# Patient Record
Sex: Male | Born: 1985 | Race: White | Hispanic: No | Marital: Married | Smoking: Never smoker
Health system: Southern US, Community
[De-identification: ages and names within clinical notes are randomized; demographics above are authoritative.]

## PROBLEM LIST (undated history)

## (undated) HISTORY — PX: HERNIA REPAIR: SHX51

---
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
## 2009-11-22 ENCOUNTER — Ambulatory Visit: Payer: Self-pay | Admitting: Orthopedic Surgery

## 2011-03-03 ENCOUNTER — Ambulatory Visit: Payer: Self-pay | Admitting: Surgery

## 2016-02-21 ENCOUNTER — Encounter: Payer: Self-pay | Admitting: Emergency Medicine

## 2016-02-21 ENCOUNTER — Emergency Department: Payer: BC Managed Care – PPO

## 2016-02-21 ENCOUNTER — Emergency Department
Admission: EM | Admit: 2016-02-21 | Discharge: 2016-02-21 | Disposition: A | Discharge status: Home / Self Care | Payer: BC Managed Care – PPO | Attending: Emergency Medicine | Admitting: Emergency Medicine

## 2016-02-21 DIAGNOSIS — Y9361 Activity, american tackle football: Secondary | ICD-10-CM | POA: Insufficient documentation

## 2016-02-21 DIAGNOSIS — W1839XA Other fall on same level, initial encounter: Secondary | ICD-10-CM | POA: Diagnosis not present

## 2016-02-21 DIAGNOSIS — M25571 Pain in right ankle and joints of right foot: Secondary | ICD-10-CM | POA: Diagnosis present

## 2016-02-21 DIAGNOSIS — S93401A Sprain of unspecified ligament of right ankle, initial encounter: Secondary | ICD-10-CM | POA: Diagnosis not present

## 2016-02-21 DIAGNOSIS — Y998 Other external cause status: Secondary | ICD-10-CM | POA: Insufficient documentation

## 2016-02-21 DIAGNOSIS — Y92321 Football field as the place of occurrence of the external cause: Secondary | ICD-10-CM | POA: Diagnosis not present

## 2016-02-21 MED ORDER — MELOXICAM 15 MG PO TABS: 15.0000 mg | ORAL_TABLET | Freq: Every day | ORAL | Status: AC

## 2016-02-21 MED ORDER — HYDROCODONE-ACETAMINOPHEN 5-325 MG PO TABS: 1.0000 | ORAL_TABLET | ORAL | Status: AC | PRN

## 2016-02-21 NOTE — ED Provider Notes (Signed)
[facility] Emergency Department Provider Note  ____________________________________________  Time seen: Approximately 7:42 PM  I have reviewed the triage vital signs and the nursing notes.   HISTORY  Chief Complaint Ankle Pain    HPI Devin Buck is a 30 y.o. male who presents emergency department complaining of right foot/ankle pain. The patient states that he was playing football with the son and the yard when he twisted his foot. He fell a cracking sensation and had pain immediately. The patient states that he is unable to bear full weight on that show any. Area is painful, sharp, constant in nature. Patient does endorse swelling to the dorsal aspect of the foot. He denies any numbness or tingling in distal foot.   History reviewed. No pertinent past medical history.  There are no active problems to display for this patient.   History reviewed. No pertinent past surgical history.  Current Outpatient Rx  Name  Route  Sig  Dispense  Refill  . HYDROcodone-acetaminophen (NORCO/VICODIN) 5-325 MG tablet   Oral   Take 1 tablet by mouth every 4 (four) hours as needed for moderate pain.   20 tablet   0   . meloxicam (MOBIC) 15 MG tablet   Oral   Take 1 tablet (15 mg total) by mouth daily.   30 tablet   0     Allergies Review of patient's allergies indicates no known allergies.  No family history on file.  Social History Social History  Substance Use Topics  . Smoking status: Never Smoker   . Smokeless tobacco: None  . Alcohol Use: None     Review of Systems  Constitutional: No fever/chills Cardiovascular: no chest pain. Respiratory: no cough. No SOB. Musculoskeletal: Positive for right foot pain Skin: Negative for rash. Neurological: Negative for headaches, focal weakness or numbness. 10-point ROS otherwise negative.  ____________________________________________   PHYSICAL EXAM:  VITAL SIGNS: ED Triage Vitals  Enc Vitals  Group     BP --      Pulse --      Resp --      Temp --      Temp src --      SpO2 --      Weight --      Height --      Head Cir --      Peak Flow --      Pain Score 02/21/16 1802 8     Pain Loc --      Pain Edu? --      Excl. in GC? --      Constitutional: Alert and oriented. Well appearing and in no acute distress. Eyes: Conjunctivae are normal. PERRL. EOMI. Head: Atraumatic. Cardiovascular: Normal rate, regular rhythm. Normal S1 and S2.  Good peripheral circulation. Respiratory: Normal respiratory effort without tachypnea or retractions. Lungs CTAB. Musculoskeletal: Edema is noted to the dorsal and lateral aspect of the right foot. No deformity noted. Patient is diffusely tender to palpation along the lateral aspect of the foot. No point tenderness. No palpable abnormality. Limited range of motion due to pain. Sensation and cap refill intact all digits. Neurologic:  Normal speech and language. No gross focal neurologic deficits are appreciated.  Skin:  Skin is warm, dry and intact. No rash noted. Psychiatric: Mood and affect are normal. Speech and behavior are normal. Patient exhibits appropriate insight and judgement.   ____________________________________________   LABS (all labs ordered are listed, but only abnormal results are displayed)  Labs  Reviewed - No data to display ____________________________________________  EKG   ____________________________________________  RADIOLOGY Festus Barren Nidya Bouyer, personally viewed and evaluated these images (plain radiographs) as part of my medical decision making, as well as reviewing the written report by the radiologist.  Dg Foot Complete Right  02/21/2016  CLINICAL DATA:  Acute onset of right foot pain and swelling after twisting injury playing football in the yard today. EXAM: RIGHT FOOT COMPLETE - 3+ VIEW COMPARISON:  None. FINDINGS: Lucency through the tibial sesamoid appears well corticated on the AP and oblique  views. Bipartite sesamoid favored over sesamoid fracture. There is otherwise no evidence of foot fracture. There is no arthropathy or other focal bone abnormality. Soft tissues are unremarkable. IMPRESSION: Presumed bipartite tibial sesamoid versus less likely sesamoid fracture. Recommend correlation for focal tenderness in this region. There is otherwise no evidence of right foot fracture. Electronically Signed   By: Rubye Oaks M.D.   On: 02/21/2016 18:53    ____________________________________________    PROCEDURES  Procedure(s) performed:       Medications - No data to display   ____________________________________________   INITIAL IMPRESSION / ASSESSMENT AND PLAN / ED COURSE  Pertinent labs & imaging results that were available during my care of the patient were reviewed by me and considered in my medical decision making (see chart for details).  Patient's diagnosis is consistent with acute ankle sprain. Patient is given crutches here in the emergency department is instructed to use a stirrup ankle brace. Patient is given instructions on the RICE method. Patient will be discharged home with prescriptions for anti-inflammatories and limited pain medication. Patient is to follow up with orthopedics if symptoms persist past this treatment course. Patient is given ED precautions to return to the ED for any worsening or new symptoms.     ____________________________________________  FINAL CLINICAL IMPRESSION(S) / ED DIAGNOSES  Final diagnoses:  Right ankle sprain, initial encounter      NEW MEDICATIONS STARTED DURING THIS VISIT:  New Prescriptions   HYDROCODONE-ACETAMINOPHEN (NORCO/VICODIN) 5-325 MG TABLET    Take 1 tablet by mouth every 4 (four) hours as needed for moderate pain.   MELOXICAM (MOBIC) 15 MG TABLET    Take 1 tablet (15 mg total) by mouth daily.        This chart was dictated using voice recognition software/Dragon. Despite best efforts to  proofread, errors can occur which can change the meaning. Any change was purely unintentional.    Racheal Patches, PA-C 02/21/16 2010  Minna Antis, MD 02/21/16 [phone]

## 2016-02-21 NOTE — ED Notes (Signed)
Pt alert and oriented X4, active, cooperative, pt in NAD. RR even and unlabored, color WNL.  Pt informed to return if any life threatening symptoms occur.   

## 2016-02-21 NOTE — ED Notes (Signed)
Reports playing football and twisted ankle, swelling noted. Ice pack applied.

## 2016-02-21 NOTE — Discharge Instructions (Signed)
Ankle Sprain °An ankle sprain is an injury to the strong, fibrous tissues (ligaments) that hold the bones of your ankle joint together.  °CAUSES °An ankle sprain is usually caused by a fall or by twisting your ankle. Ankle sprains most commonly occur when you step on the outer edge of your foot, and your ankle turns inward. People who participate in sports are more prone to these types of injuries.  °SYMPTOMS  °· Pain in your ankle. The pain may be present at rest or only when you are trying to stand or walk. °· Swelling. °· Bruising. Bruising may develop immediately or within 1 to 2 days after your injury. °· Difficulty standing or walking, particularly when turning corners or changing directions. °DIAGNOSIS  °Your caregiver will ask you details about your injury and perform a physical exam of your ankle to determine if you have an ankle sprain. During the physical exam, your caregiver will press on and apply pressure to specific areas of your foot and ankle. Your caregiver will try to move your ankle in certain ways. An X-ray exam may be done to be sure a bone was not broken or a ligament did not separate from one of the bones in your ankle (avulsion fracture).  °TREATMENT  °Certain types of braces can help stabilize your ankle. Your caregiver can make a recommendation for this. Your caregiver may recommend the use of medicine for pain. If your sprain is severe, your caregiver may refer you to a surgeon who helps to restore function to parts of your skeletal system (orthopedist) or a physical therapist. °HOME CARE INSTRUCTIONS  °· Apply ice to your injury for 1-2 days or as directed by your caregiver. Applying ice helps to reduce inflammation and pain. °· Put ice in a plastic bag. °· Place a towel between your skin and the bag. °· Leave the ice on for 15-20 minutes at a time, every 2 hours while you are awake. °· Only take over-the-counter or prescription medicines for pain, discomfort, or fever as directed by  your caregiver. °· Elevate your injured ankle above the level of your heart as much as possible for 2-3 days. °· If your caregiver recommends crutches, use them as instructed. Gradually put weight on the affected ankle. Continue to use crutches or a cane until you can walk without feeling pain in your ankle. °· If you have a plaster splint, wear the splint as directed by your caregiver. Do not rest it on anything harder than a pillow for the first 24 hours. Do not put weight on it. Do not get it wet. You may take it off to take a shower or bath. °· You may have been given an elastic bandage to wear around your ankle to provide support. If the elastic bandage is too tight (you have numbness or tingling in your foot or your foot becomes cold and blue), adjust the bandage to make it comfortable. °· If you have an air splint, you may blow more air into it or let air out to make it more comfortable. You may take your splint off at night and before taking a shower or bath. Wiggle your toes in the splint several times per day to decrease swelling. °SEEK MEDICAL CARE IF:  °· You have rapidly increasing bruising or swelling. °· Your toes feel extremely cold or you lose feeling in your foot. °· Your pain is not relieved with medicine. °SEEK IMMEDIATE MEDICAL CARE IF: °· Your toes are numb or blue. °·   You have severe pain that is increasing. °MAKE SURE YOU:  °· Understand these instructions. °· Will watch your condition. °· Will get help right away if you are not doing well or get worse. °  °This information is not intended to replace advice given to you by your health care provider. Make sure you discuss any questions you have with your health care provider. °  °Document Released: 11/02/2005 Document Revised: 11/23/2014 Document Reviewed: 11/14/2011 °Elsevier Interactive Patient Education ©2016 Elsevier Inc. ° °Cryotherapy °Cryotherapy means treatment with cold. Ice or gel packs can be used to reduce both pain and swelling.  Ice is the most helpful within the first 24 to 48 hours after an injury or flare-up from overusing a muscle or joint. Sprains, strains, spasms, burning pain, shooting pain, and aches can all be eased with ice. Ice can also be used when recovering from surgery. Ice is effective, has very few side effects, and is safe for most people to use. °PRECAUTIONS  °Ice is not a safe treatment option for people with: °· Raynaud phenomenon. This is a condition affecting small blood vessels in the extremities. Exposure to cold may cause your problems to return. °· Cold hypersensitivity. There are many forms of cold hypersensitivity, including: °¨ Cold urticaria. Red, itchy hives appear on the skin when the tissues begin to warm after being iced. °¨ Cold erythema. This is a red, itchy rash caused by exposure to cold. °¨ Cold hemoglobinuria. Red blood cells break down when the tissues begin to warm after being iced. The hemoglobin that carry oxygen are passed into the urine because they cannot combine with blood proteins fast enough. °· Numbness or altered sensitivity in the area being iced. °If you have any of the following conditions, do not use ice until you have discussed cryotherapy with your caregiver: °· Heart conditions, such as arrhythmia, angina, or chronic heart disease. °· High blood pressure. °· Healing wounds or open skin in the area being iced. °· Current infections. °· Rheumatoid arthritis. °· Poor circulation. °· Diabetes. °Ice slows the blood flow in the region it is applied. This is beneficial when trying to stop inflamed tissues from spreading irritating chemicals to surrounding tissues. However, if you expose your skin to cold temperatures for too long or without the proper protection, you can damage your skin or nerves. Watch for signs of skin damage due to cold. °HOME CARE INSTRUCTIONS °Follow these tips to use ice and cold packs safely. °· Place a dry or damp towel between the ice and skin. A damp towel will  cool the skin more quickly, so you may need to shorten the time that the ice is used. °· For a more rapid response, add gentle compression to the ice. °· Ice for no more than 10 to 20 minutes at a time. The bonier the area you are icing, the less time it will take to get the benefits of ice. °· Check your skin after 5 minutes to make sure there are no signs of a poor response to cold or skin damage. °· Rest 20 minutes or more between uses. °· Once your skin is numb, you can end your treatment. You can test numbness by very lightly touching your skin. The touch should be so light that you do not see the skin dimple from the pressure of your fingertip. When using ice, most people will feel these normal sensations in this order: cold, burning, aching, and numbness. °· Do not use ice on someone who   cannot communicate their responses to pain, such as small children or people with dementia. °HOW TO MAKE AN ICE PACK °Ice packs are the most common way to use ice therapy. Other methods include ice massage, ice baths, and cryosprays. Muscle creams that cause a cold, tingly feeling do not offer the same benefits that ice offers and should not be used as a substitute unless recommended by your caregiver. °To make an ice pack, do one of the following: °· Place crushed ice or a bag of frozen vegetables in a sealable plastic bag. Squeeze out the excess air. Place this bag inside another plastic bag. Slide the bag into a pillowcase or place a damp towel between your skin and the bag. °· Mix 3 parts water with 1 part rubbing alcohol. Freeze the mixture in a sealable plastic bag. When you remove the mixture from the freezer, it will be slushy. Squeeze out the excess air. Place this bag inside another plastic bag. Slide the bag into a pillowcase or place a damp towel between your skin and the bag. °SEEK MEDICAL CARE IF: °· You develop white spots on your skin. This may give the skin a blotchy (mottled) appearance. °· Your skin turns  blue or pale. °· Your skin becomes waxy or hard. °· Your swelling gets worse. °MAKE SURE YOU:  °· Understand these instructions. °· Will watch your condition. °· Will get help right away if you are not doing well or get worse. °  °This information is not intended to replace advice given to you by your health care provider. Make sure you discuss any questions you have with your health care provider. °  °Document Released: 06/29/2011 Document Revised: 11/23/2014 Document Reviewed: 06/29/2011 °Elsevier Interactive Patient Education ©2016 Elsevier Inc. ° °Stirrup Ankle Brace °Stirrup ankle braces give support and help stabilize the ankle joint. They are rigid pieces of plastic or fiberglass that go up both sides of the lower leg with the bottom of the stirrup fitting comfortably under the bottom of the instep of the foot. It can be held on with Velcro straps or an elastic wrap. Stirrup ankle braces are used to support the ankle following mild or moderate sprains or strains, or fractures after cast removal.  °They can be easily removed or adjusted if there is swelling. The rigid brace shells are designed to fit the ankle comfortably and provide the needed medial/lateral stabilization. This brace can be easily worn with most athletic shoes. The brace liner is usually made of a soft, comfortable gel-like material. This gel fits the ankle well without causing uncomfortable pressure points.  °IMPORTANCE OF ANKLE BRACES: °· The use of ankle bracing is effective in the prevention of ankle sprains. °· In athletes, the use of ankle bracing will offer protection and prevent further sprains. °· Research shows that a complete rehabilitation program needs to be included with external bracing. This includes range of motion and ankle strengthening exercises. Your caregivers will instruct you in this. °If you were given the brace today for a new injury, use the following home care instructions as a guide. °HOME CARE INSTRUCTIONS   °· Apply ice to the sore area for 15-20 minutes, 03-04 times per day while awake for the first 2 days. Put the ice in a plastic bag and place a towel between the bag of ice and your skin. Never place the ice pack directly on your skin. Be especially careful using ice on an elbow or knee or other bony area, such as   your ankle, because icing for too long may damage the nerves which are close to the surface.  Keep your leg elevated when possible to lessen swelling.  Wear your splint until you are seen for a follow-up examination. Do not put weight on it. Do not get it wet. You may take it off to take a shower or bath.  For Activity: Use crutches with non-weight bearing for 1 week. Then, you may walk on your ankle as instructed. Start gradually with weight bearing on the affected ankle.  Continue to use crutches or a cane until you can stand on your ankle without causing pain.  Wiggle your toes in the splint several times per day if you are able.  The splint is too tight if you have numbness, tingling, or if your foot becomes cold and blue. Adjust the straps or elastic bandage to make it comfortable.  Only take over-the-counter or prescription medicines for pain, discomfort, or fever as directed by your caregiver. SEEK IMMEDIATE MEDICAL CARE IF:   You have increased bruising, swelling or pain.  Your toes are blue or cold and loosening the brace or wrap does not help.  Your pain is not relieved with medicine. MAKE SURE YOU:   Understand these instructions.  Will watch your condition.  Will get help right away if you are not doing well or get worse.   This information is not intended to replace advice given to you by your health care provider. Make sure you discuss any questions you have with your health care provider.   Document Released: 09/02/2004 Document Revised: 01/25/2012 Document Reviewed: 06/04/2015 Elsevier Interactive Patient Education 2016 Elsevier Inc.  Adult nurselastic Bandage and  RICE WHAT DOES AN ELASTIC BANDAGE DO? Elastic bandages come in different shapes and sizes. They generally provide support to your injury and reduce swelling while you are healing, but they can perform different functions. Your health care provider will help you to decide what is best for your protection, recovery, or rehabilitation following an injury. WHAT ARE SOME GENERAL TIPS FOR USING AN ELASTIC BANDAGE?  Use the bandage as directed by the maker of the bandage that you are using.  Do not wrap the bandage too tightly. This may cut off the circulation in the arm or leg in the area below the bandage.  If part of your body beyond the bandage becomes blue, numb, cold, swollen, or is more painful, your bandage is most likely too tight. If this occurs, remove your bandage and reapply it more loosely.  See your health care provider if the bandage seems to be making your problems worse rather than better.  An elastic bandage should be removed and reapplied every 3-4 hours or as directed by your health care provider. WHAT IS RICE? The routine care of many injuries includes rest, ice, compression, and elevation (RICE therapy).  Rest Rest is required to allow your body to heal. Generally, you can resume your routine activities when you are comfortable and have been given permission by your health care provider. Ice Icing your injury helps to keep the swelling down and it reduces pain. Do not apply ice directly to your skin.  Put ice in a plastic bag.  Place a towel between your skin and the bag.  Leave the ice on for 20 minutes, 2-3 times per day. Do this for as long as you are directed by your health care provider. Compression Compression helps to keep swelling down, gives support, and helps with discomfort. Compression may  be done with an elastic bandage. Elevation Elevation helps to reduce swelling and it decreases pain. If possible, your injured area should be placed at or above the level  of your heart or the center of your chest. WHEN SHOULD I SEEK MEDICAL CARE? You should seek medical care if:  You have persistent pain and swelling.  Your symptoms are getting worse rather than improving. These symptoms may indicate that further evaluation or further X-rays are needed. Sometimes, X-rays may not show a small broken bone (fracture) until a number of days later. Make a follow-up appointment with your health care provider. Ask when your X-ray results will be ready. Make sure that you get your X-ray results. WHEN SHOULD I SEEK IMMEDIATE MEDICAL CARE? You should seek immediate medical care if:  You have a sudden onset of severe pain at or below the area of your injury.  You develop redness or increased swelling around your injury.  You have tingling or numbness at or below the area of your injury that does not improve after you remove the elastic bandage.   This information is not intended to replace advice given to you by your health care provider. Make sure you discuss any questions you have with your health care provider.   Document Released: 04/24/2002 Document Revised: 07/24/2015 Document Reviewed: 06/18/2014 Elsevier Interactive Patient Education [employer] Else[employer] Inc[employer]

## 2016-02-21 NOTE — ED Notes (Signed)
Pt states that he and his son were playing football in the yard and he twisted his foot, pt states that he felt it crack and immediately had pain, pt states that he has been unable to bear weight and is painful, pt has swelling to the top portion of his rt foot, +sensation, +pedal pulse

## 2017-01-19 IMAGING — DX DG FOOT COMPLETE 3+V*R*
3 series · 3 of 3 positions shown · non-contrast
Comparison: None.

CLINICAL DATA: Acute onset of right foot pain and swelling after
twisting injury playing football in the yard today.

EXAM:
RIGHT FOOT COMPLETE - 3+ VIEW

[foot ap]
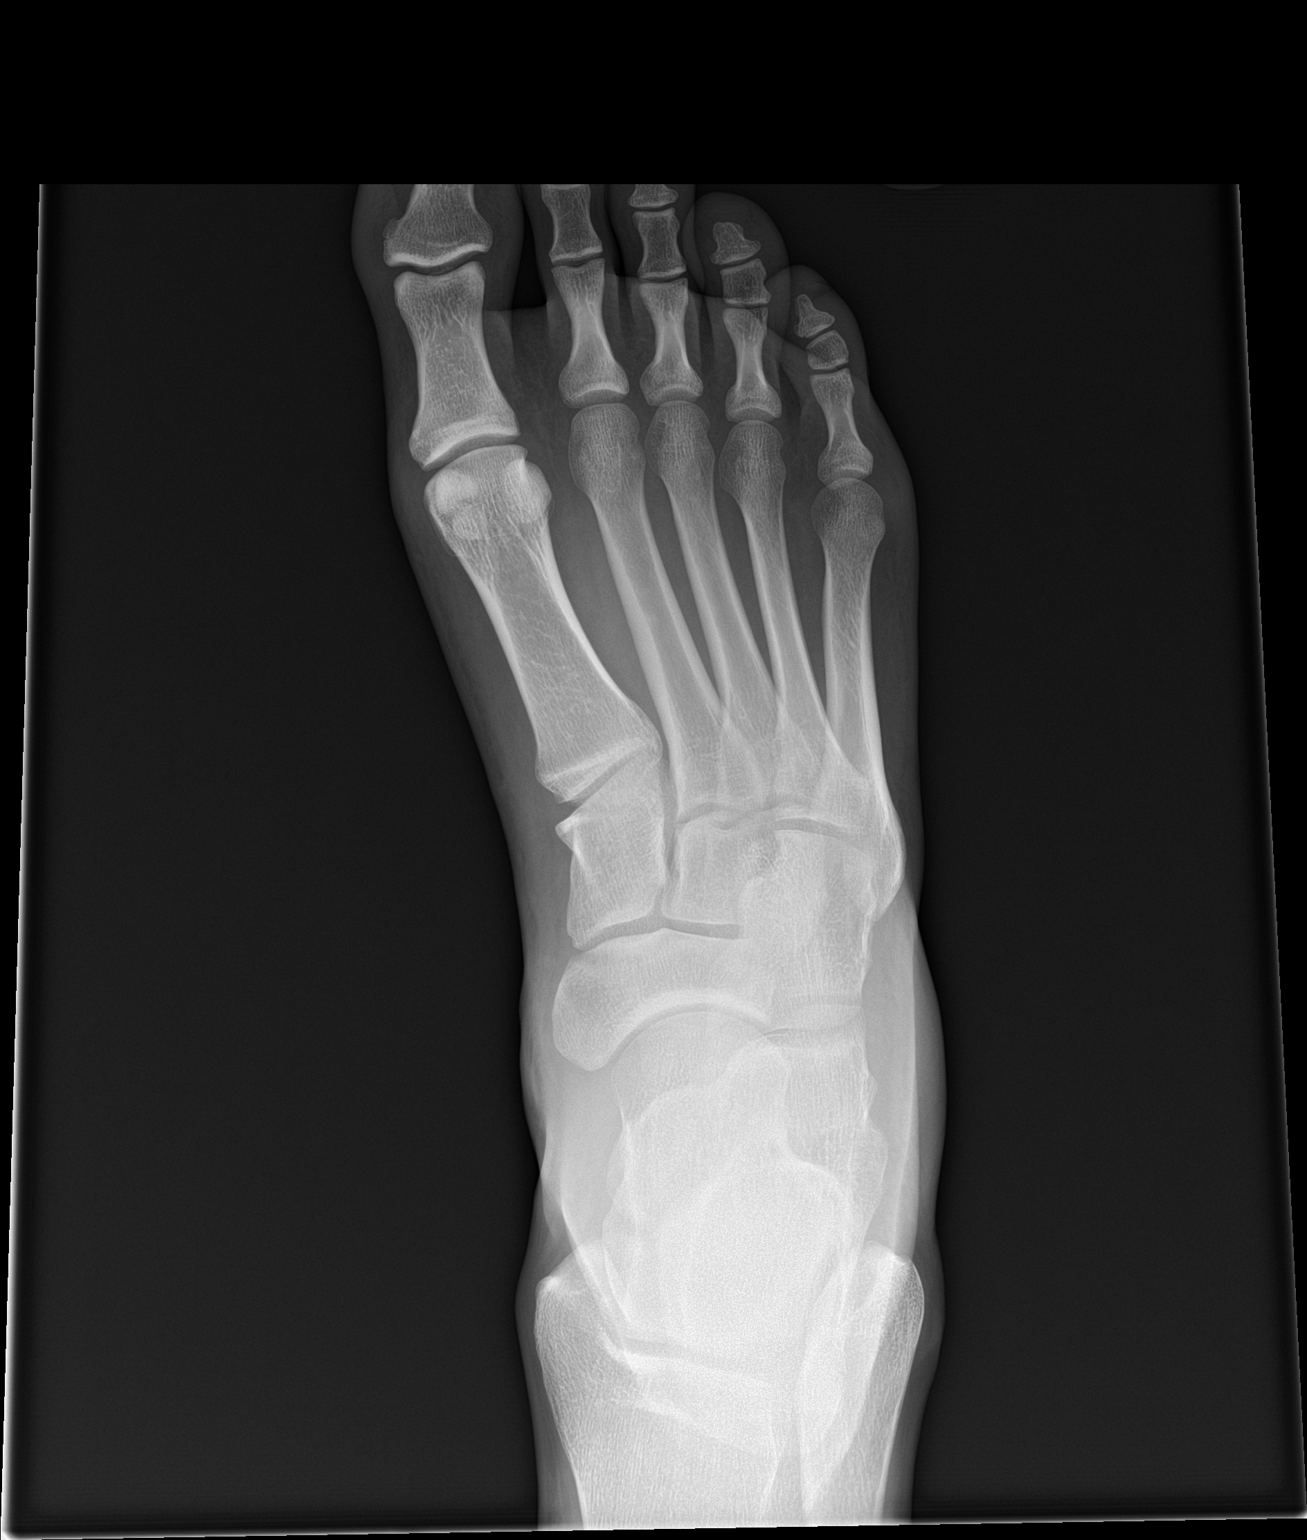

[foot obl]
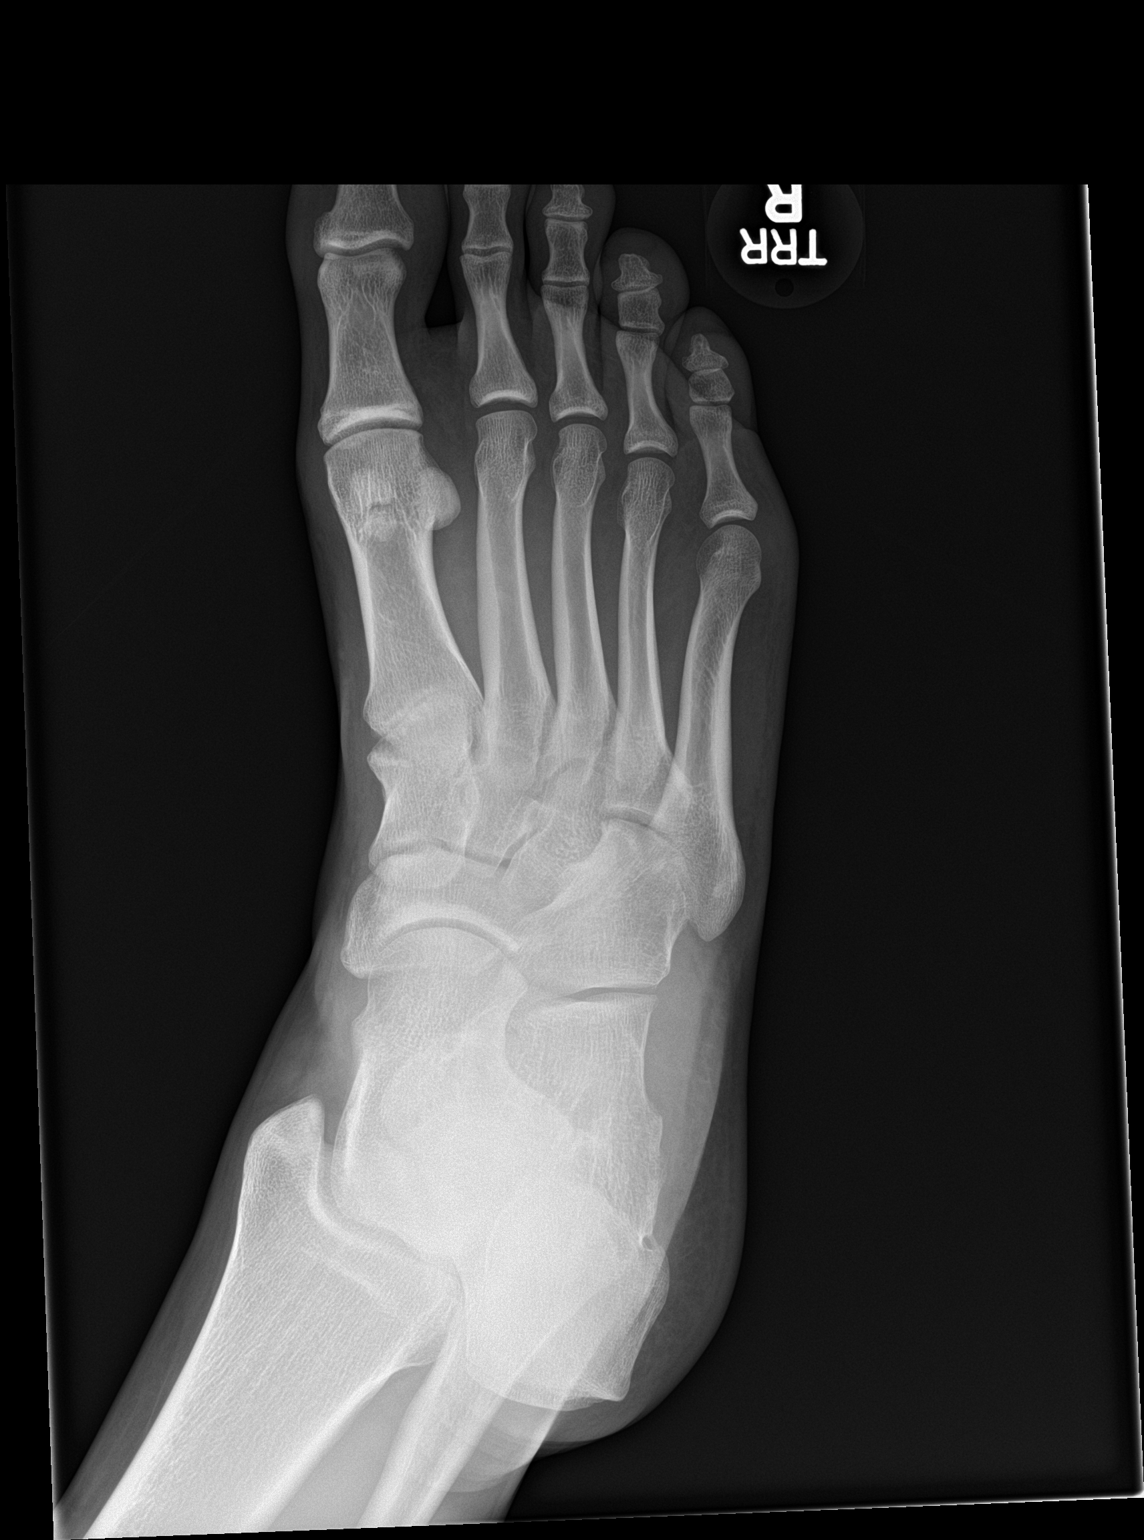

[foot lat]
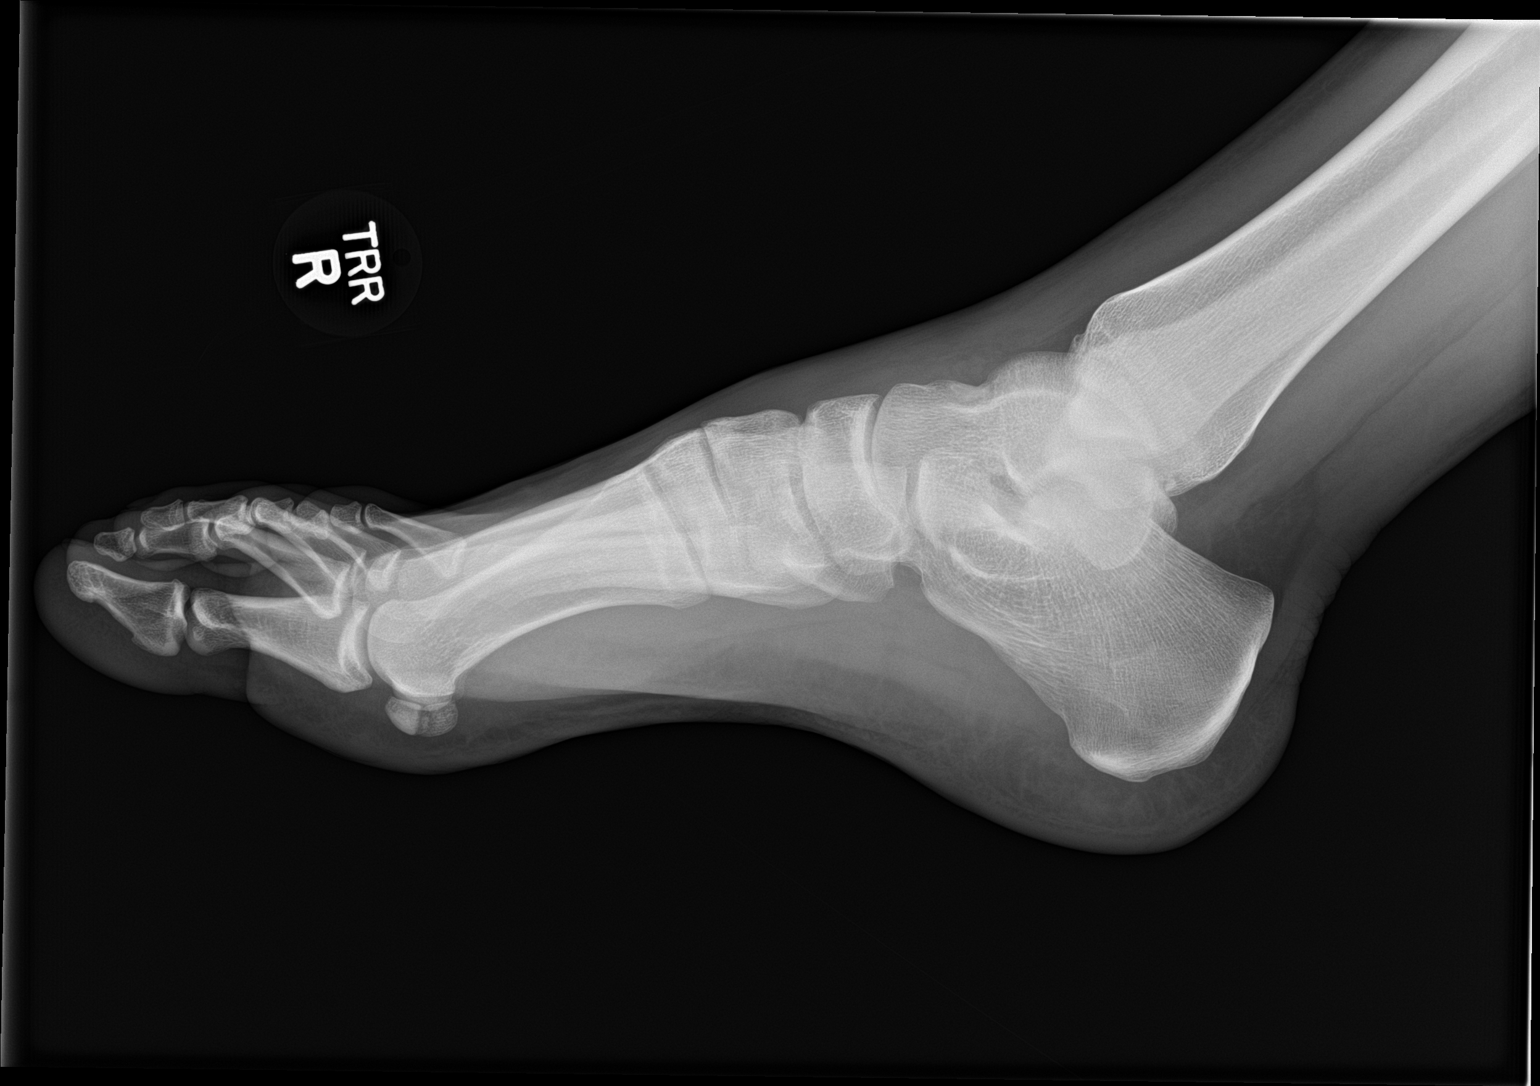

[3 of 3 positions shown; findings below may reference images not displayed]

FINDINGS: Lucency through the tibial sesamoid appears well corticated on the
AP and oblique views. Bipartite sesamoid favored over sesamoid
fracture. There is otherwise no evidence of foot fracture. There is
no arthropathy or other focal bone abnormality. Soft tissues are
unremarkable.
IMPRESSION: Presumed bipartite tibial sesamoid versus less likely sesamoid
fracture. Recommend correlation for focal tenderness in this region.
There is otherwise no evidence of right foot fracture.

## 2017-12-23 ENCOUNTER — Encounter: Payer: Self-pay | Admitting: Emergency Medicine

## 2017-12-23 ENCOUNTER — Emergency Department
Admission: EM | Admit: 2017-12-23 | Discharge: 2017-12-23 | Disposition: A | Discharge status: Home / Self Care | Payer: BC Managed Care – PPO | Attending: Emergency Medicine | Admitting: Emergency Medicine

## 2017-12-23 ENCOUNTER — Other Ambulatory Visit: Payer: Self-pay

## 2017-12-23 DIAGNOSIS — S01112A Laceration without foreign body of left eyelid and periocular area, initial encounter: Secondary | ICD-10-CM | POA: Insufficient documentation

## 2017-12-23 DIAGNOSIS — Z79899 Other long term (current) drug therapy: Secondary | ICD-10-CM | POA: Insufficient documentation

## 2017-12-23 DIAGNOSIS — S0181XA Laceration without foreign body of other part of head, initial encounter: Secondary | ICD-10-CM

## 2017-12-23 DIAGNOSIS — W51XXXA Accidental striking against or bumped into by another person, initial encounter: Secondary | ICD-10-CM | POA: Insufficient documentation

## 2017-12-23 DIAGNOSIS — S0993XA Unspecified injury of face, initial encounter: Secondary | ICD-10-CM | POA: Diagnosis present

## 2017-12-23 DIAGNOSIS — Y9367 Activity, basketball: Secondary | ICD-10-CM | POA: Diagnosis not present

## 2017-12-23 DIAGNOSIS — Y999 Unspecified external cause status: Secondary | ICD-10-CM | POA: Insufficient documentation

## 2017-12-23 DIAGNOSIS — Y929 Unspecified place or not applicable: Secondary | ICD-10-CM | POA: Insufficient documentation

## 2017-12-23 MED ORDER — ACETAMINOPHEN 325 MG PO TABS
650.0000 mg | ORAL_TABLET | Freq: Once | ORAL | Status: AC
  Administered 2017-12-23: 650 mg via ORAL
  Filled 2017-12-23: qty 2

## 2017-12-23 NOTE — ED Provider Notes (Signed)
[facility] Emergency Department Provider Note   ____________________________________________   First MD Initiated Contact with Patient 12/23/17 2113     (approximate)  I have reviewed the triage vital signs and the nursing notes.   HISTORY  Chief Complaint Laceration    HPI Devin Buck is a 32 y.o. male patient presents with laceration to the left eyebrow.  Patient was head butted while playing basketball prior to arrival.  Patient denies pain, vision disturbance, or vertigo.  Pressure dressings prior to arrival.  History reviewed. No pertinent past medical history.  There are no active problems to display for this patient.   Past Surgical History:  Procedure Laterality Date  . HERNIA REPAIR      Prior to Admission medications   Medication Sig Start Date End Date Taking? Authorizing Provider  HYDROcodone-acetaminophen (NORCO/VICODIN) 5-325 MG tablet Take 1 tablet by mouth every 4 (four) hours as needed for moderate pain. 02/21/16   Cuthriell, Delorise Royals, PA-C  meloxicam (MOBIC) 15 MG tablet Take 1 tablet (15 mg total) by mouth daily. 02/21/16   Cuthriell, Delorise Royals, PA-C    Allergies Patient has no known allergies.  No family history on file.  Social History Social History   Tobacco Use  . Smoking status: Never Smoker  . Smokeless tobacco: Never Used  Substance Use Topics  . Alcohol use: Not on file  . Drug use: Not on file    Review of Systems Constitutional: No fever/chills Eyes: No visual changes. ENT: No sore throat. Cardiovascular: Denies chest pain. Respiratory: Denies shortness of breath. Gastrointestinal: No abdominal pain.  No nausea, no vomiting.  No diarrhea.  No constipation. Genitourinary: Negative for dysuria. Musculoskeletal: Negative for back pain. Skin: Laceration to left eyebrow neurological: Negative for headaches, focal weakness or numbness.   ____________________________________________   PHYSICAL  EXAM:  VITAL SIGNS: ED Triage Vitals  Enc Vitals Group     BP 12/23/17 2052 (!) 143/67     Pulse Rate 12/23/17 2052 76     Resp 12/23/17 2052 18     Temp 12/23/17 2052 98 F (36.7 C)     Temp Source 12/23/17 2052 Oral     SpO2 12/23/17 2052 100 %     Weight 12/23/17 2050 215 lb (97.5 kg)     Height 12/23/17 2050 6' (1.829 m)     Head Circumference --      Peak Flow --      Pain Score --      Pain Loc --      Pain Edu? --      Excl. in GC? --     Constitutional: Alert and oriented. Well appearing and in no acute distress. Eyes: Conjunctivae are normal. PERRL. EOMI. Head: Atraumatic. Nose: No congestion/rhinnorhea. Mouth/Throat: Mucous membranes are moist.  Oropharynx non-erythematous. Neck: No stridor.  No cervical spine tenderness to palpation. Cardiovascular: Normal rate, regular rhythm. Grossly normal heart sounds.  Good peripheral circulation. Respiratory: Normal respiratory effort.  No retractions. Lungs CTAB. Neurologic:  Normal speech and language. No gross focal neurologic deficits are appreciated. No gait instability. Skin:  Skin is warm, dry and intact. No rash noted. Psychiatric: Mood and affect are normal. Speech and behavior are normal.  ____________________________________________   LABS (all labs ordered are listed, but only abnormal results are displayed)  Labs Reviewed - No data to display ____________________________________________  EKG   ____________________________________________  RADIOLOGY  ED MD interpretation:    Official radiology report(s): No results found.  ____________________________________________   PROCEDURES  Procedure(s) performed: None  .Marland Kitchen.Laceration Repair Date/Time: 12/23/2017 9:32 PM Performed by: Joni ReiningSmith, Ronald K, PA-C Authorized by: Joni ReiningSmith, Ronald K, PA-C   Consent:    Consent obtained:  Verbal   Consent given by:  Patient   Risks discussed:  Infection and poor cosmetic result Anesthesia (see MAR for exact  dosages):    Anesthesia method:  None Laceration details:    Location:  Face   Face location:  L eyebrow   Length (cm):  2 Repair type:    Repair type:  Simple Pre-procedure details:    Preparation:  Patient was prepped and draped in usual sterile fashion Exploration:    Hemostasis achieved with:  Direct pressure   Contaminated: no   Treatment:    Area cleansed with:  Betadine and saline   Irrigation solution:  Sterile saline   Irrigation method:  Syringe Skin repair:    Repair method:  Tissue adhesive Approximation:    Approximation:  Close Post-procedure details:    Dressing:  Sterile dressing   Patient tolerance of procedure:  Tolerated well, no immediate complications    Critical Care performed: No  ____________________________________________   INITIAL IMPRESSION / ASSESSMENT AND PLAN / ED COURSE  As part of my medical decision making, I reviewed the following data within the electronic MEDICAL RECORD NUMBER    Left eyebrow laceration.  Area was surgically clean and closed with Dermabond.  Patient given discharge care instructions.  Patient advised to return to ED if wound reopens for healing is complete.  Advised Tylenol ibuprofen as needed for pain.   ____________________________________________   FINAL CLINICAL IMPRESSION(S) / ED DIAGNOSES  Final diagnoses:  Facial laceration, initial encounter     ED Discharge Orders    None       Note:  This document was prepared using Dragon voice recognition software and may include unintentional dictation errors.    Joni ReiningSmith, Ronald K, PA-C 12/23/17 2134    Pershing ProudSchaevitz, Myra Rudeavid Matthew, MD 12/23/17 2206

## 2017-12-23 NOTE — ED Triage Notes (Addendum)
Patient ambulatory to triage with steady gait, without difficulty or distress noted; pt reports was head-butted PTA while playing ball; lac noted above left eyebrow; denies pain; dressing D&I

## 2024-09-18 ENCOUNTER — Other Ambulatory Visit: Payer: Self-pay

## 2024-09-18 ENCOUNTER — Emergency Department
Admission: EM | Admit: 2024-09-18 | Discharge: 2024-09-19 | Disposition: A | Discharge status: Home / Self Care | Attending: Emergency Medicine | Admitting: Emergency Medicine

## 2024-09-18 DIAGNOSIS — S0101XA Laceration without foreign body of scalp, initial encounter: Secondary | ICD-10-CM | POA: Insufficient documentation

## 2024-09-18 DIAGNOSIS — W01198A Fall on same level from slipping, tripping and stumbling with subsequent striking against other object, initial encounter: Secondary | ICD-10-CM | POA: Diagnosis not present

## 2024-09-18 DIAGNOSIS — R55 Syncope and collapse: Secondary | ICD-10-CM | POA: Diagnosis not present

## 2024-09-18 DIAGNOSIS — S0990XA Unspecified injury of head, initial encounter: Secondary | ICD-10-CM | POA: Diagnosis present

## 2024-09-18 LAB — COMPREHENSIVE METABOLIC PANEL WITH GFR
ALT: 17 U/L (ref 0–44)
AST: 21 U/L (ref 15–41)
Albumin: 4.9 g/dL (ref 3.5–5.0)
Alkaline Phosphatase: 45 U/L (ref 38–126)
Anion gap: 12 (ref 5–15)
BUN: 22 mg/dL — ABNORMAL HIGH (ref 6–20)
CO2: 25 mmol/L (ref 22–32)
Calcium: 9.5 mg/dL (ref 8.9–10.3)
Chloride: 103 mmol/L (ref 98–111)
Creatinine, Ser: 1.3 mg/dL — ABNORMAL HIGH (ref 0.61–1.24)
GFR, Estimated: >60 mL/min (ref >60)
Glucose, Bld: 115 mg/dL — ABNORMAL HIGH (ref 70–99)
Potassium: 3.3 mmol/L — ABNORMAL LOW (ref 3.5–5.1)
Sodium: 140 mmol/L (ref 135–145)
Total Bilirubin: 1 mg/dL (ref 0.0–1.2)
Total Protein: 7.5 g/dL (ref 6.5–8.1)

## 2024-09-18 LAB — CBC
HCT: 42 % (ref 39.0–52.0)
Hemoglobin: 14.9 g/dL (ref 13.0–17.0)
MCH: 29.5 pg (ref 26.0–34.0)
MCHC: 35.5 g/dL (ref 30.0–36.0)
MCV: 83.2 fL (ref 80.0–100.0)
Platelets: 214 K/uL (ref 150–400)
RBC: 5.05 MIL/uL (ref 4.22–5.81)
RDW: 11.8 % (ref 11.5–15.5)
WBC: 9.5 K/uL (ref 4.0–10.5)
nRBC: 0 % (ref 0.0–0.2)

## 2024-09-18 LAB — CBG MONITORING, ED: Glucose-Capillary: 116 mg/dL — ABNORMAL HIGH (ref 70–99)

## 2024-09-18 LAB — TROPONIN I (HIGH SENSITIVITY): Troponin I (High Sensitivity): <2 ng/L (ref <18)

## 2024-09-18 MED ORDER — LIDOCAINE HCL (PF) 1 % IJ SOLN
5.0000 mL | Freq: Once | INTRAMUSCULAR | Status: AC
  Administered 2024-09-18: 5 mL
  Filled 2024-09-18: qty 5

## 2024-09-18 MED ORDER — SODIUM CHLORIDE 0.9 % IV BOLUS: 1000.0000 mL | Freq: Once | INTRAVENOUS | Status: DC

## 2024-09-18 NOTE — ED Provider Notes (Signed)
 [facility] Provider Note    Event Date/Time   First MD Initiated Contact with Patient 09/18/24 1950     (approximate)   History   Loss of Consciousness   HPI  Devin Buck is a 38 y.o. male who presents to the emergency department today because of concerns for syncopal episode.  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  He has to sit down but before he could he passed out.  He did hit the back of his head.  Suffered a laceration.  I was some discomfort at the site of the laceration but denies any significant headache.  Patient is not on blood thinners.  He has passed out before.  Does state that he thinks he might not of ate or drink as much is normally today.  Denies any chest pain or palpitations.      Physical Exam   Triage Vital Signs: ED Triage Vitals  Encounter Vitals Group     BP 09/18/24 1942 (!) 96/55     Girls Systolic BP Percentile --      Girls Diastolic BP Percentile --      Boys Systolic BP Percentile --      Boys Diastolic BP Percentile --      Pulse Rate 09/18/24 1942 (!) 45     Resp 09/18/24 1942 11     Temp 09/18/24 1942 97.9 F (36.6 C)     Temp Source 09/18/24 1942 Oral     SpO2 09/18/24 1942 100 %     Weight 09/18/24 1943 215 lb 6.2 oz (97.7 kg)     Height 09/18/24 1943 6' (1.829 m)     Head Circumference --      Peak Flow --      Pain Score 09/18/24 1943 3     Pain Loc --      Pain Education --      Exclude from Growth Chart --     Most recent vital signs: Vitals:   09/18/24 1942 09/18/24 1945  BP: (!) 96/55 (!) 105/59  Pulse: (!) 45 (!) 48  Resp: 11 11  Temp: 97.9 F (36.6 C)   SpO2: 100% 100%   General: Awake, alert, oriented. CV:  Good peripheral perfusion. Bradycardia, regular rhythm. No murmurs. Resp:  Normal effort. Lungs clear. Abd:  No distention.  Other:  1.5 cm linear laceration to  occiput  ED Results / Procedures / Treatments   Labs (all labs ordered are listed, but only abnormal results are displayed) Labs Reviewed  COMPREHENSIVE METABOLIC PANEL WITH GFR - Abnormal; Notable for the following components:      Result Value   Potassium 3.3 (*)    Glucose, Bld 115 (*)    BUN 22 (*)    Creatinine, Ser 1.30 (*)    All other components within normal limits  CBG MONITORING, ED - Abnormal; Notable for the following components:   Glucose-Capillary 116 (*)    All other components within normal limits  CBC  CBG MONITORING, ED     EKG  I, Guadalupe Eagles, attending physician, personally viewed and interpreted this EKG  EKG Time: 1929 Rate: 46 Rhythm: sinus bradycardia with 1st degree av block Axis: normal Intervals: qtc 385 QRS: narrow ST changes: no st elevation  Impression: abnormal ekg  RADIOLOGY None   PROCEDURES:  Critical Care performed: No  Procedures  LACERATION REPAIR Performed by: Guadalupe Eagles Authorized by: Guadalupe Eagles Consent: Verbal consent obtained. Risks and benefits: risks, benefits and alternatives were discussed Consent given by: patient Patient identity confirmed: provided demographic data Prepped and Draped in normal sterile fashion Wound explored  Laceration Location: occiput  Laceration Length: 1.5 cm  No Foreign Bodies seen or palpated  Anesthesia: local infiltration  Local anesthetic: lidocaine 1% without epinephrine  Anesthetic total: 2 ml  Irrigation method: syringe Amount of cleaning: standard  Skin closure: staples  Number of staples: 3  Patient tolerance: Did have vasovagal type response after lidocaine administration which required a brief pause before we could resume the procedure.    MEDICATIONS ORDERED IN ED: Medications - No data to display   IMPRESSION / MDM / ASSESSMENT AND PLAN / ED COURSE  I reviewed the triage vital signs and the nursing notes.                               Differential diagnosis includes, but is not limited to, vasovagal, anemia, arrhythmia, ACS, electrolyte abnormality, hypoglycemia  Patient's presentation is most consistent with acute presentation with potential threat to life or bodily function.   Patient presented to the emergency department today after syncopal episode while he was in the ICU visiting a patient.  On my exam patient is awake and alert.  Patient does have a history of slow heart rate at baseline.  Blood work here without significant abnormality, very slight hypokalemia although I doubt this because the patient's symptoms.  Did show slightly higher creatinine than I would expect.  I discussed this with the patient.  He states he does not think he drink as much is normal today so I do think he is likely dehydrated which could be contributing to the syncope.  He did suffer a laceration to the back of his head although no concerning signs or symptoms for significant intracranial process.  During the procedure to close his laceration he did have what appeared to be a vasovagal episode with brief hypotension and bradycardia.  This did quickly resolve and we were able to finish the procedure.      FINAL CLINICAL IMPRESSION(S) / ED DIAGNOSES   Final diagnoses:  Syncope, unspecified syncope type  Laceration of scalp, initial encounter     Note:  This document was prepared using Dragon voice recognition software and may include unintentional dictation errors.    Eagles Guadalupe, MD 09/19/24 [phone]

## 2024-09-18 NOTE — ED Triage Notes (Signed)
 Pt brought done by ICU nurse; pt was visiting pt in ICU 18, felt faint and fell down against door; lac to back of head; monitor initially indicated HR 20's then back to 50's; pt reported not eating anything recently but FSBS was in the 100s

## 2024-09-18 NOTE — ED Triage Notes (Signed)
 Pt arrives to room 6 after 2 syncopal episodes. Pt was in the ICU visiting family member when he felt faint and fell down. First syncopal episode in the ICU pt fell back hitting his head and has a laceration on back of head. Pt HR 20's initially. BGL 116. PT reports feeling weak. Pt Aox4. Unsure of last tetanus.

## 2024-09-19 NOTE — ED Notes (Signed)
 PT in no acute distress prior to discharge. Discharged instructions reviewed, all questions answered and pt verbalized understanding at this time. Pt has all belongings with them at time of discharge.

## 2024-09-21 ENCOUNTER — Other Ambulatory Visit: Payer: Self-pay | Admitting: Internal Medicine

## 2024-09-21 ENCOUNTER — Ambulatory Visit
Admission: RE | Admit: 2024-09-21 | Discharge: 2024-09-21 | Disposition: A | Discharge status: Home / Self Care | Source: Ambulatory Visit | Attending: Internal Medicine | Admitting: Internal Medicine

## 2024-09-21 DIAGNOSIS — F0670 Mild neurocognitive disorder due to known physiological condition without behavioral disturbance: Secondary | ICD-10-CM | POA: Insufficient documentation

## 2024-09-21 DIAGNOSIS — S069XAS Unspecified intracranial injury with loss of consciousness status unknown, sequela: Secondary | ICD-10-CM | POA: Insufficient documentation

## 2024-09-21 DIAGNOSIS — F0781 Postconcussional syndrome: Secondary | ICD-10-CM | POA: Insufficient documentation

## 2024-09-21 DIAGNOSIS — R55 Syncope and collapse: Secondary | ICD-10-CM | POA: Insufficient documentation

## 2024-09-21 DIAGNOSIS — X58XXXS Exposure to other specified factors, sequela: Secondary | ICD-10-CM | POA: Insufficient documentation
